# Patient Record
Sex: Female | Born: 1980 | Race: White | Hispanic: No | Marital: Single | Smoking: Current every day smoker
Health system: Southern US, Community
[De-identification: ages and names within clinical notes are randomized; demographics above are authoritative.]

## PROBLEM LIST (undated history)

## (undated) DIAGNOSIS — F603 Borderline personality disorder: Secondary | ICD-10-CM

## (undated) DIAGNOSIS — E039 Hypothyroidism, unspecified: Secondary | ICD-10-CM

## (undated) DIAGNOSIS — N200 Calculus of kidney: Secondary | ICD-10-CM

## (undated) DIAGNOSIS — B1910 Unspecified viral hepatitis B without hepatic coma: Secondary | ICD-10-CM

## (undated) DIAGNOSIS — F4310 Post-traumatic stress disorder, unspecified: Secondary | ICD-10-CM

## (undated) DIAGNOSIS — F419 Anxiety disorder, unspecified: Secondary | ICD-10-CM

## (undated) DIAGNOSIS — F319 Bipolar disorder, unspecified: Secondary | ICD-10-CM

## (undated) HISTORY — PX: LITHOTRIPSY: SUR834

## (undated) HISTORY — PX: CYST REMOVAL NECK: SHX6281

---

## 2018-03-20 ENCOUNTER — Emergency Department (HOSPITAL_COMMUNITY)
Admission: EM | Admit: 2018-03-20 | Discharge: 2018-03-20 | Disposition: A | Discharge status: Home / Self Care | Payer: Medicaid Other | Attending: Emergency Medicine | Admitting: Emergency Medicine

## 2018-03-20 ENCOUNTER — Emergency Department (HOSPITAL_COMMUNITY): Payer: Medicaid Other

## 2018-03-20 ENCOUNTER — Encounter (HOSPITAL_COMMUNITY): Payer: Self-pay

## 2018-03-20 DIAGNOSIS — F17210 Nicotine dependence, cigarettes, uncomplicated: Secondary | ICD-10-CM | POA: Diagnosis not present

## 2018-03-20 DIAGNOSIS — M722 Plantar fascial fibromatosis: Secondary | ICD-10-CM | POA: Diagnosis not present

## 2018-03-20 DIAGNOSIS — E039 Hypothyroidism, unspecified: Secondary | ICD-10-CM | POA: Insufficient documentation

## 2018-03-20 DIAGNOSIS — M79671 Pain in right foot: Secondary | ICD-10-CM | POA: Diagnosis present

## 2018-03-20 DIAGNOSIS — Z79899 Other long term (current) drug therapy: Secondary | ICD-10-CM | POA: Diagnosis not present

## 2018-03-20 HISTORY — DX: Borderline personality disorder: F60.3

## 2018-03-20 HISTORY — DX: Unspecified viral hepatitis B without hepatic coma: B19.10

## 2018-03-20 HISTORY — DX: Post-traumatic stress disorder, unspecified: F43.10

## 2018-03-20 HISTORY — DX: Bipolar disorder, unspecified: F31.9

## 2018-03-20 HISTORY — DX: Anxiety disorder, unspecified: F41.9

## 2018-03-20 HISTORY — DX: Hypothyroidism, unspecified: E03.9

## 2018-03-20 HISTORY — DX: Calculus of kidney: N20.0

## 2018-03-20 MED ORDER — OXYCODONE-ACETAMINOPHEN 5-325 MG PO TABS
1.0000 | ORAL_TABLET | Freq: Once | ORAL | Status: AC
  Administered 2018-03-20: 1 via ORAL
  Filled 2018-03-20: qty 1

## 2018-03-20 NOTE — ED Provider Notes (Signed)
[facility]   CSN: [id number] Arrival date & time: 03/20/18  0244     History   Chief Complaint Chief Complaint  Patient presents with  . Foot Pain    HPI Jill Cobb is a 37 y.o. female.  The history is provided by the patient. No language interpreter was used.  Foot Pain  This is a new problem. The current episode started yesterday. The problem occurs constantly. The problem has been gradually worsening. Pertinent negatives include no abdominal pain. The symptoms are aggravated by walking. Nothing relieves the symptoms. Treatments tried: Naproxen. The treatment provided no relief.    Past Medical History:  Diagnosis Date  . Anxiety   . Bipolar 1 disorder (HCC)   . Borderline personality disorder in adult [facility](HCC)   . Hepatitis B   . Hypothyroid   . Kidney stones   . PTSD (post-traumatic stress disorder)     There are no active problems to display for this patient.   Past Surgical History:  Procedure Laterality Date  . CYST REMOVAL NECK    . LITHOTRIPSY       OB History   None      Home Medications    Prior to Admission medications   Medication Sig Start Date End Date Taking? Authorizing Provider  cyclobenzaprine (FLEXERIL) 10 MG tablet Take 10 mg by mouth 3 (three) times daily as needed for muscle spasms. 03/12/18 03/22/18 Yes [provider]  DULoxetine (CYMBALTA) 30 MG capsule Take 30 mg by mouth daily. 01/18/18  Yes [provider]  lactulose (CHRONULAC) 10 GM/15ML solution Take 20 g by mouth daily as needed for mild constipation.   Yes [provider]  levothyroxine (SYNTHROID) 50 MCG tablet Take 50 mcg by mouth daily.   Yes [provider]  naproxen (EC NAPROSYN) 500 MG EC tablet Take 500 mg by mouth 2 (two) times daily. 03/12/18  Yes [provider]  phentermine (ADIPEX-P) 37.5 MG tablet Take 37.5 mg by mouth daily. 02/26/18  Yes [provider]     Family History No family history on file.  Social History Social History   Tobacco Use  . Smoking status: Current Every Day Smoker  . Smokeless tobacco: Never Used  Substance Use Topics  . Alcohol use: Not Currently    Frequency: Never  . Drug use: Never     Allergies   Amoxicillin   Review of Systems Review of Systems  Gastrointestinal: Negative for abdominal pain.  Musculoskeletal:       +R foot pain  Ten systems reviewed and are negative for acute change, except as noted in the HPI.     Physical Exam Updated Vital Signs BP 108/64   Pulse 84   Temp 98.8 F (37.1 C) (Oral)   Resp 16   Ht 5\' 7"  (1.702 m)   Wt 135.2 kg (298 lb)   LMP 03/13/2018   SpO2 97%   BMI 46.67 kg/m   Physical Exam  Constitutional: She is oriented to person, place, and time. She appears well-developed and well-nourished. No distress.  Nontoxic appearing and in NAD  HENT:  Head: Normocephalic and atraumatic.  Eyes: Conjunctivae and EOM are normal. No scleral icterus.  Neck: Normal range of motion.  Cardiovascular: Normal rate, regular rhythm and intact distal pulses.  DP pulse 2+ in the RLE  Pulmonary/Chest: Effort normal. No respiratory distress.  Musculoskeletal: Normal range of motion.       Right foot: There  is tenderness.       Feet:  Neurological: She is alert and oriented to person, place, and time. She exhibits normal muscle tone. Coordination normal.  Sensation to light touch intact. Patient able to wiggle all toes.  Skin: Skin is warm and dry. No rash noted. She is not diaphoretic. No erythema. No pallor.  Psychiatric: She has a normal mood and affect. Her behavior is normal.  Nursing note and vitals reviewed.    ED Treatments / Results  Labs (all labs ordered are listed, but only abnormal results are displayed) Labs Reviewed - No data to display  EKG None  Radiology Dg Foot Complete Right  Result Date: 03/20/2018 CLINICAL DATA:  RIGHT foot pain tonight.   No injury. EXAM: RIGHT FOOT COMPLETE - 3+ VIEW COMPARISON:  None. FINDINGS: There is no evidence of fracture or dislocation. There is no evidence of arthropathy or other focal bone abnormality. Small plantar calcaneal spur. Soft tissues are unremarkable. IMPRESSION: Negative. Electronically Signed   By: Awilda Metro M.D.   On: 03/20/2018 03:24    Procedures Procedures (including critical care time)  Medications Ordered in ED Medications  oxyCODONE-acetaminophen (PERCOCET/ROXICET) 5-325 MG per tablet 1 tablet (has no administration in time range)     Initial Impression / Assessment and Plan / ED Course  I have reviewed the triage vital signs and the nursing notes.  Pertinent labs & imaging results that were available during my care of the patient were reviewed by me and considered in my medical decision making (see chart for details).     Patient presents to the emergency department for evaluation of atraumatic R foot pain. Patient neurovascularly intact on exam. Imaging negative for fracture, dislocation, bony deformity. No swelling, erythema, heat to touch to the affected area; no concern for septic joint. Compartments in the affected extremity are soft. Symptoms most c/w plantar fasciitis.  Plan for supportive management including RICE and NSAIDs; primary care follow up as needed. Return precautions discussed and provided. Patient discharged in stable condition with no unaddressed concerns.   Final Clinical Impressions(s) / ED Diagnoses   Final diagnoses:  Plantar fasciitis of right foot    ED Discharge Orders    None       Antony Madura, PA-C 03/20/18 1610    Shon Baton, MD 03/20/18 [phone]

## 2018-03-20 NOTE — ED Triage Notes (Signed)
Pt states that her r foot has been hurting since yesterday morning, some mild swelling, pt reports pain is shooting up into R knee and hip, denies injury

## 2018-03-20 NOTE — ED Notes (Signed)
PT states understanding of care given, follow up care, and medication prescribed. PT ambulated from ED to car with a steady gait. 

## 2018-04-04 SURGERY — Surgical Case
Anesthesia: *Unknown

## 2019-04-07 IMAGING — CR DG FOOT COMPLETE 3+V*R*
3 series · 3 of 3 positions shown · non-contrast
Comparison: None.

CLINICAL DATA: RIGHT foot pain tonight.  No injury.

EXAM:
RIGHT FOOT COMPLETE - 3+ VIEW

[foot ap]
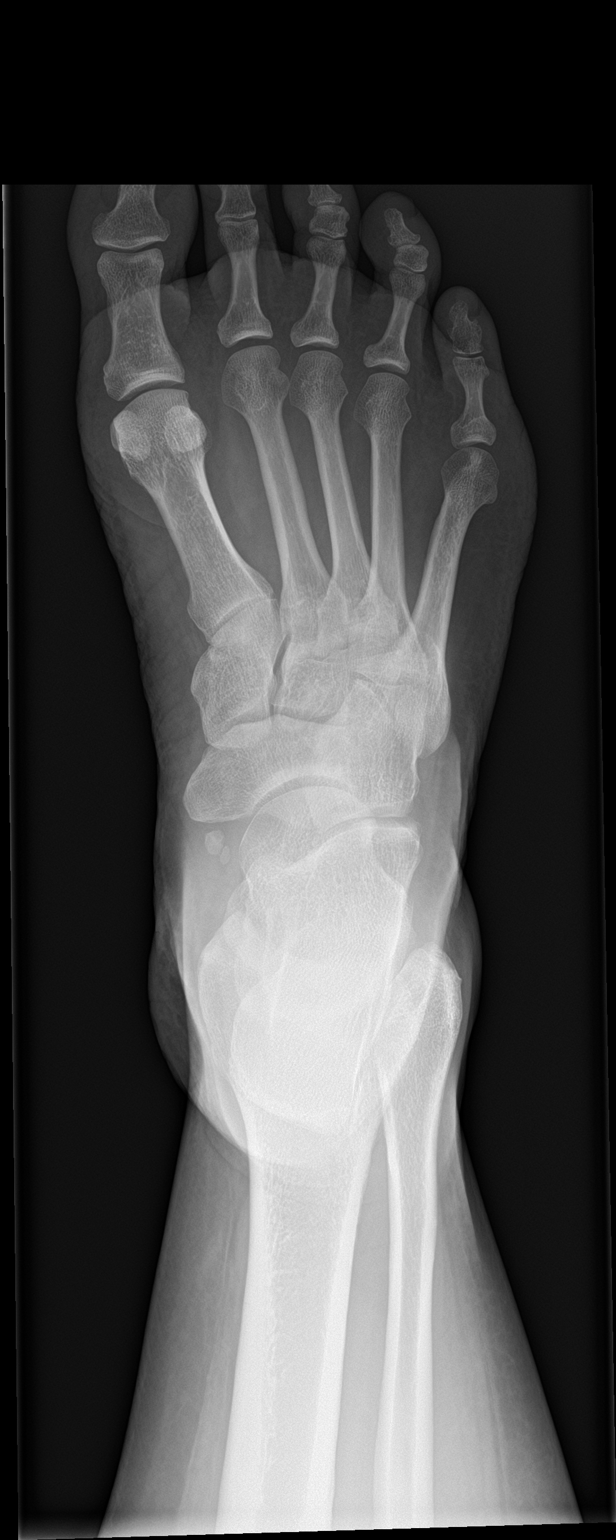

[foot obl]
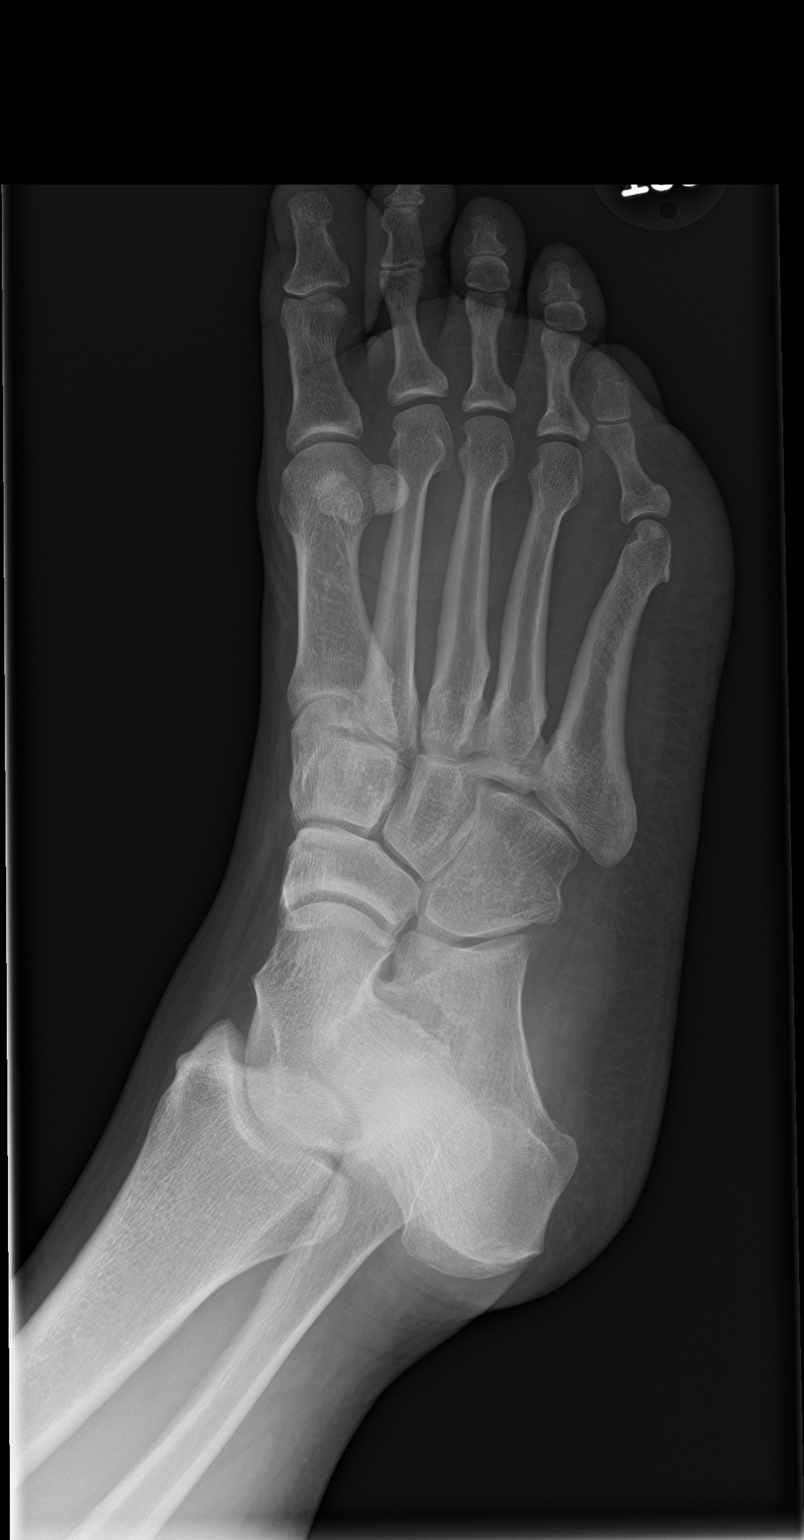

[foot lat]
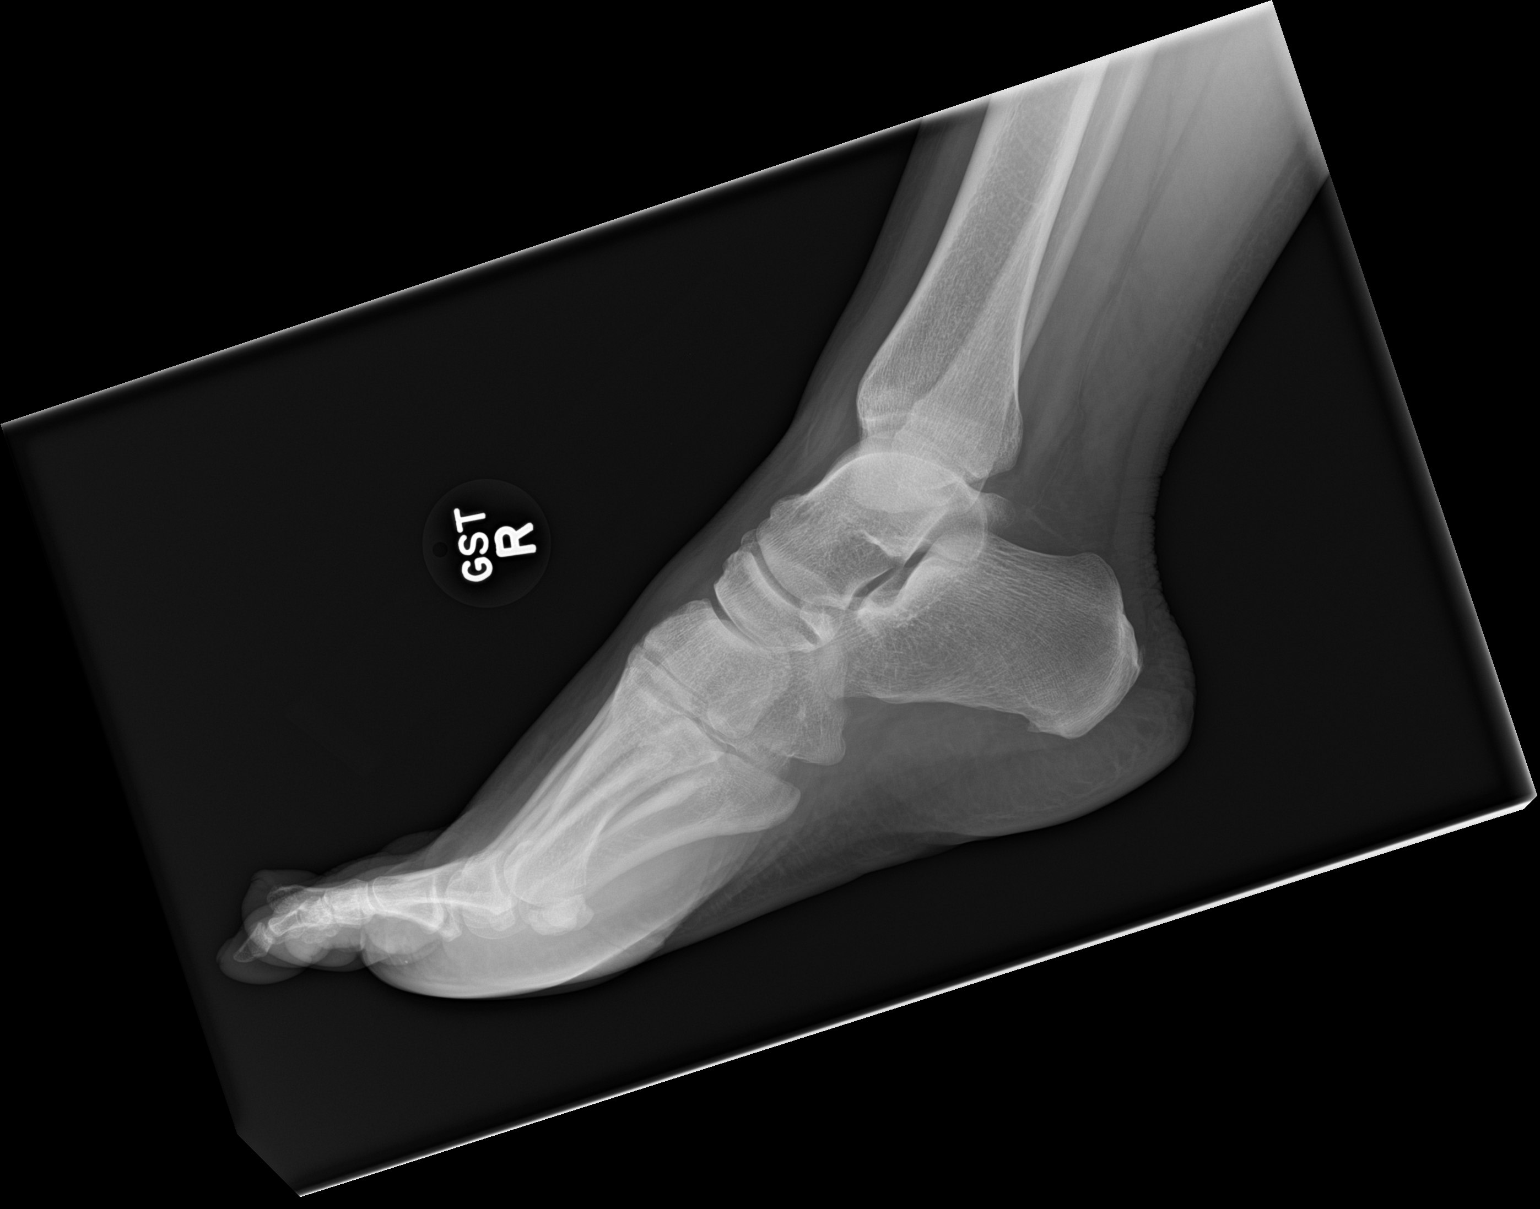

[3 of 3 positions shown; findings below may reference images not displayed]

FINDINGS: There is no evidence of fracture or dislocation. There is no
evidence of arthropathy or other focal bone abnormality. Small
plantar calcaneal spur. Soft tissues are unremarkable.
IMPRESSION: Negative.
